# Patient Record
Sex: Male | Born: 1965 | Race: White | Hispanic: No | Marital: Married | Smoking: Current every day smoker
Health system: Southern US, Community
[De-identification: ages and names within clinical notes are randomized; demographics above are authoritative.]

---

## 2004-05-18 ENCOUNTER — Encounter: Admission: RE | Admit: 2004-05-18 | Discharge: 2004-05-18 | Payer: Self-pay | Admitting: Orthopaedic Surgery

## 2004-06-09 ENCOUNTER — Encounter: Admission: RE | Admit: 2004-06-09 | Discharge: 2004-06-09 | Payer: Self-pay | Admitting: Orthopaedic Surgery

## 2015-01-11 ENCOUNTER — Ambulatory Visit (HOSPITAL_COMMUNITY)
Admission: RE | Admit: 2015-01-11 | Discharge: 2015-01-11 | Disposition: A | Discharge status: Home / Self Care | Payer: BLUE CROSS/BLUE SHIELD | Source: Ambulatory Visit | Attending: Family Medicine | Admitting: Family Medicine

## 2015-01-11 ENCOUNTER — Other Ambulatory Visit (HOSPITAL_COMMUNITY): Payer: Self-pay | Admitting: Family Medicine

## 2015-01-11 ENCOUNTER — Encounter (HOSPITAL_COMMUNITY): Payer: Self-pay | Admitting: Emergency Medicine

## 2015-01-11 ENCOUNTER — Inpatient Hospital Stay (HOSPITAL_COMMUNITY)
Admission: EM | Admit: 2015-01-11 | Discharge: 2015-01-13 | DRG: 340 | Disposition: A | Discharge status: Home / Self Care | Payer: BLUE CROSS/BLUE SHIELD | Attending: Surgery | Admitting: Surgery

## 2015-01-11 DIAGNOSIS — Z8379 Family history of other diseases of the digestive system: Secondary | ICD-10-CM

## 2015-01-11 DIAGNOSIS — R1031 Right lower quadrant pain: Secondary | ICD-10-CM

## 2015-01-11 DIAGNOSIS — D3502 Benign neoplasm of left adrenal gland: Secondary | ICD-10-CM | POA: Diagnosis present

## 2015-01-11 DIAGNOSIS — K3580 Unspecified acute appendicitis: Secondary | ICD-10-CM | POA: Diagnosis present

## 2015-01-11 DIAGNOSIS — Z720 Tobacco use: Secondary | ICD-10-CM

## 2015-01-11 DIAGNOSIS — K353 Acute appendicitis with localized peritonitis, without perforation or gangrene: Secondary | ICD-10-CM

## 2015-01-11 DIAGNOSIS — F17200 Nicotine dependence, unspecified, uncomplicated: Secondary | ICD-10-CM | POA: Diagnosis present

## 2015-01-11 LAB — BASIC METABOLIC PANEL
Anion gap: 10 (ref 5–15)
BUN: 17 mg/dL (ref 6–20)
CO2: 24 mmol/L (ref 22–32)
Calcium: 8.7 mg/dL — ABNORMAL LOW (ref 8.9–10.3)
Chloride: 102 mmol/L (ref 101–111)
Creatinine, Ser: 0.95 mg/dL (ref 0.61–1.24)
GFR calc Af Amer: >60 mL/min (ref >60)
GFR calc non Af Amer: >60 mL/min (ref >60)
Glucose, Bld: 97 mg/dL (ref 65–99)
Potassium: 3.6 mmol/L (ref 3.5–5.1)
Sodium: 136 mmol/L (ref 135–145)

## 2015-01-11 LAB — CBC WITH DIFFERENTIAL/PLATELET
Basophils Absolute: 0 10*3/uL (ref 0.0–0.1)
Basophils Relative: 0 %
Eosinophils Absolute: 0 10*3/uL (ref 0.0–0.7)
Eosinophils Relative: 0 %
HCT: 46.4 % (ref 39.0–52.0)
Hemoglobin: 15.9 g/dL (ref 13.0–17.0)
Lymphocytes Relative: 12 %
Lymphs Abs: 1.4 10*3/uL (ref 0.7–4.0)
MCH: 30.8 pg (ref 26.0–34.0)
MCHC: 34.3 g/dL (ref 30.0–36.0)
MCV: 89.9 fL (ref 78.0–100.0)
Monocytes Absolute: 1.1 10*3/uL — ABNORMAL HIGH (ref 0.1–1.0)
Monocytes Relative: 9 %
Neutro Abs: 9.3 10*3/uL — ABNORMAL HIGH (ref 1.7–7.7)
Neutrophils Relative %: 79 %
Platelets: 190 10*3/uL (ref 150–400)
RBC: 5.16 MIL/uL (ref 4.22–5.81)
RDW: 13.9 % (ref 11.5–15.5)
WBC: 11.8 10*3/uL — ABNORMAL HIGH (ref 4.0–10.5)

## 2015-01-11 MED ORDER — IOHEXOL 300 MG/ML  SOLN
75.0000 mL | Freq: Once | INTRAMUSCULAR | Status: AC | PRN
  Administered 2015-01-11: 75 mL via INTRAVENOUS

## 2015-01-11 NOTE — ED Provider Notes (Signed)
CSN: [id number]     Arrival date & time 01/11/15  2048 History   First MD Initiated Contact with Patient 01/11/15 2108     Chief Complaint  Patient presents with  . appendicitis      (Consider location/radiation/quality/duration/timing/severity/associated sxs/prior Treatment) HPI Patient presents to the emergency department with abdominal pain that started late Saturday evening and got worse as the day went on Sunday and Continued Pain Today.  He Called His Primary Care Doctor Who Evaluated Him and Then Ordered a CT Scan That He Was Sent Here Due To the Fact That CT Scan Showed Acute Appendicitis.  Patient States That He Has Not Had Any Nausea, Vomiting, diarrhea, bloody stool, hematemesis, dysuria, incontinence, lightheadedness, chest pain, shortness of breath, near syncope or syncope.  the patient states that palpation and movement make the pain worse.  patient states he did not take any medications prior to arrival.  he states that he last ate some day and had a cheeseburger. History reviewed. No pertinent past medical history. History reviewed. No pertinent past surgical history. History reviewed. No pertinent family history. Social History  Substance Use Topics  . Smoking status: Light Tobacco Smoker  . Smokeless tobacco: Never Used  . Alcohol Use: Yes    Review of Systems   All other systems negative except as documented in the HPI. All pertinent positives and negatives as reviewed in the HPI. Allergies  Review of patient's allergies indicates no known allergies.  Home Medications   Prior to Admission medications   Medication Sig Start Date End Date Taking? Authorizing Provider  acetaminophen (TYLENOL) 500 MG tablet Take 1,000 mg by mouth every 6 (six) hours as needed for moderate pain.   Yes Historical Provider, MD  ibuprofen (ADVIL,MOTRIN) 200 MG tablet Take 400 mg by mouth every 6 (six) hours as needed for moderate pain.   Yes Historical Provider, MD  naproxen sodium  (ANAPROX) 220 MG tablet Take 440 mg by mouth 2 (two) times daily as needed (pain).   Yes Historical Provider, MD   BP 127/61 mmHg  Pulse 86  Temp(Src) 99.5 F (37.5 C) (Oral)  Resp 18  Ht 6\' 3"  (1.905 m)  Wt 111.131 kg  BMI 30.62 kg/m2  SpO2 98% Physical Exam  Constitutional: He is oriented to person, place, and time. He appears well-developed and well-nourished. No distress.  HENT:  Head: Normocephalic and atraumatic.  Mouth/Throat: Oropharynx is clear and moist.  Eyes: Pupils are equal, round, and reactive to light.  Neck: Normal range of motion. Neck supple.  Cardiovascular: Normal rate, regular rhythm and normal heart sounds.  Exam reveals no gallop and no friction rub.   No murmur heard. Pulmonary/Chest: Effort normal and breath sounds normal. No respiratory distress. He has no wheezes.  Abdominal: Soft. Bowel sounds are normal. He exhibits no distension. There is tenderness. There is rebound and guarding.    Neurological: He is alert and oriented to person, place, and time. He exhibits normal muscle tone. Coordination normal.  Skin: Skin is warm and dry. No rash noted. No erythema.  Psychiatric: He has a normal mood and affect. His behavior is normal.  Nursing note and vitals reviewed.   ED Course  Procedures (including critical care time) Labs Review Labs Reviewed  CBC WITH DIFFERENTIAL/PLATELET - Abnormal; Notable for the following:    WBC 11.8 (*)    Neutro Abs 9.3 (*)    Monocytes Absolute 1.1 (*)    All other components within normal limits  BASIC METABOLIC  PANEL    Imaging Review Ct Abdomen Pelvis W Contrast  01/11/2015  CLINICAL DATA:  Right lower quadrant pain EXAM: CT ABDOMEN AND PELVIS WITH CONTRAST TECHNIQUE: Multidetector CT imaging of the abdomen and pelvis was performed using the standard protocol following bolus administration of intravenous contrast. Oral contrast was also administered. CONTRAST:  76mL OMNIPAQUE IOHEXOL 300 MG/ML  SOLN COMPARISON:   None. FINDINGS: Lower chest: There is mild bibasilar atelectasis. Lung bases otherwise are clear. Hepatobiliary: No focal liver lesions are identified. Gallbladder wall is not appreciably thickened. There is no biliary duct dilatation. Pancreas: No pancreatic mass or inflammatory focus. Spleen: No splenic lesions are identified. Adrenals/Urinary Tract: Right adrenal appears normal. There is a left adrenal adenoma measuring 2.5 x 2.2 cm. Kidneys bilaterally show no mass or hydronephrosis. There is no renal or ureteral calculus on either side. Urinary bladder is midline with wall thickness within normal limits. Stomach/Bowel: There is no bowel wall or mesenteric thickening. There are a few sigmoid diverticula without diverticulitis. There is fat within the wall of the terminal ileum. No bowel obstruction. No free air or portal venous air. Vascular/Lymphatic: There is no abdominal aortic aneurysm. The major mesenteric arterial vessels appear patent. No adenopathy is seen in the abdomen or pelvis. There are a few small lymph nodes in the right lower quadrant in the area of appendiceal inflammation, likely of reactive etiology. Reproductive: Prostate contains several small calculi. Prostate and seminal vesicles are normal in size and contour. There is no pelvic mass or pelvic fluid collection. Other: There is enlargement of the appendix with thickening of the appendiceal wall and surrounding inflammation consistent with acute appendicitis. No well-defined abscess. No free air is seen in this region. No ascites or abscess is seen in the abdomen or pelvis. Musculoskeletal: There is degenerative change in the lumbar spine. There are no blastic or lytic bone lesions. There is no intramuscular were abdominal wall lesion. IMPRESSION: Evidence of acute appendiceal inflammation. No frank abscess is seen in the periappendiceal region. Small lymph nodes in this area are likely of reactive etiology. Left adrenal adenoma. Fat in the  wall of the terminal ileum.  No bowel obstruction. No renal or ureteral calculus.  No hydronephrosis. Critical Value/emergent results were called by telephone at the time of interpretation on 01/11/2015 at 8:27 pm to Dr. Martinique, who is covering for Dr. Nancy Fetter, who verbally acknowledged these results. We arranged for patient to go straight from CT to the [facility] ED for further evaluation. Electronically Signed   By: Lowella Grip III M.D.   On: 01/11/2015 20:28   I have personally reviewed and evaluated these images and lab results as part of my medical decision-making.   Patient is a CT scan that shows acute appendicitis.  He has a white count of 11,800.  He also has classic right lower quadrant abdominal pain on palpation along with rebound and guarding.  I did speak with general surgery.  The patient's vital signs are otherwise stable.  The patient is advised with plan and all questions were answered  Dalia Heading, PA-C 01/11/15 2217  Sherwood Gambler, MD 01/14/15 1010

## 2015-01-11 NOTE — ED Notes (Signed)
Patient has been having pain since Saturday morning. Patient went to the doctor. Patient was sent to radiology and they sent him here with a diagnosis of an appendicitis.

## 2015-01-12 ENCOUNTER — Encounter (HOSPITAL_COMMUNITY): Admission: EM | Disposition: A | Discharge status: Home / Self Care | Payer: Self-pay | Source: Home / Self Care

## 2015-01-12 ENCOUNTER — Observation Stay (HOSPITAL_COMMUNITY): Payer: BLUE CROSS/BLUE SHIELD | Admitting: Anesthesiology

## 2015-01-12 DIAGNOSIS — Z8379 Family history of other diseases of the digestive system: Secondary | ICD-10-CM | POA: Diagnosis not present

## 2015-01-12 DIAGNOSIS — K353 Acute appendicitis with localized peritonitis, without perforation or gangrene: Secondary | ICD-10-CM | POA: Diagnosis present

## 2015-01-12 DIAGNOSIS — D3502 Benign neoplasm of left adrenal gland: Secondary | ICD-10-CM | POA: Diagnosis present

## 2015-01-12 DIAGNOSIS — F17200 Nicotine dependence, unspecified, uncomplicated: Secondary | ICD-10-CM | POA: Diagnosis present

## 2015-01-12 DIAGNOSIS — R1031 Right lower quadrant pain: Secondary | ICD-10-CM | POA: Diagnosis present

## 2015-01-12 DIAGNOSIS — Z720 Tobacco use: Secondary | ICD-10-CM

## 2015-01-12 DIAGNOSIS — K3580 Unspecified acute appendicitis: Secondary | ICD-10-CM | POA: Diagnosis present

## 2015-01-12 HISTORY — PX: LAPAROSCOPIC APPENDECTOMY: SHX408

## 2015-01-12 LAB — SURGICAL PCR SCREEN
MRSA, PCR: NEGATIVE
Staphylococcus aureus: NEGATIVE

## 2015-01-12 SURGERY — APPENDECTOMY, LAPAROSCOPIC
Anesthesia: General | Site: Abdomen

## 2015-01-12 MED ORDER — ZOLPIDEM TARTRATE 5 MG PO TABS
5.0000 mg | ORAL_TABLET | Freq: Every evening | ORAL | Status: DC | PRN
  Administered 2015-01-12: 5 mg via ORAL
  Filled 2015-01-12: qty 1

## 2015-01-12 MED ORDER — HYDROMORPHONE HCL 1 MG/ML IJ SOLN
0.5000 mg | INTRAMUSCULAR | Status: DC | PRN
  Administered 2015-01-12: 1 mg via INTRAVENOUS
  Filled 2015-01-12: qty 1

## 2015-01-12 MED ORDER — MIDAZOLAM HCL 5 MG/5ML IJ SOLN
INTRAMUSCULAR | Status: DC | PRN
  Administered 2015-01-12: 2 mg via INTRAVENOUS

## 2015-01-12 MED ORDER — MENTHOL 3 MG MT LOZG
1.0000 | LOZENGE | OROMUCOSAL | Status: DC | PRN
  Filled 2015-01-12: qty 9

## 2015-01-12 MED ORDER — PROMETHAZINE HCL 25 MG/ML IJ SOLN
6.2500 mg | INTRAMUSCULAR | Status: DC | PRN
  Filled 2015-01-12: qty 1

## 2015-01-12 MED ORDER — GLYCOPYRROLATE 0.2 MG/ML IJ SOLN
INTRAMUSCULAR | Status: AC
  Filled 2015-01-12: qty 3

## 2015-01-12 MED ORDER — LACTATED RINGERS IV SOLN
INTRAVENOUS | Status: DC | PRN
  Administered 2015-01-12 (×2): via INTRAVENOUS

## 2015-01-12 MED ORDER — NAPROXEN 500 MG PO TABS
500.0000 mg | ORAL_TABLET | Freq: Two times a day (BID) | ORAL | Status: DC
  Administered 2015-01-12 – 2015-01-13 (×2): 500 mg via ORAL
  Filled 2015-01-12 (×4): qty 1

## 2015-01-12 MED ORDER — LACTATED RINGERS IV BOLUS (SEPSIS)
1000.0000 mL | Freq: Once | INTRAVENOUS | Status: AC
  Administered 2015-01-12: 1000 mL via INTRAVENOUS

## 2015-01-12 MED ORDER — PHENOL 1.4 % MT LIQD
2.0000 | OROMUCOSAL | Status: DC | PRN
  Filled 2015-01-12: qty 177

## 2015-01-12 MED ORDER — ROCURONIUM BROMIDE 100 MG/10ML IV SOLN
INTRAVENOUS | Status: AC
  Filled 2015-01-12: qty 1

## 2015-01-12 MED ORDER — KCL IN DEXTROSE-NACL 40-5-0.45 MEQ/L-%-% IV SOLN
INTRAVENOUS | Status: DC
  Administered 2015-01-12: 14:00:00 via INTRAVENOUS
  Administered 2015-01-13: 75 mL/h via INTRAVENOUS
  Filled 2015-01-12 (×2): qty 1000

## 2015-01-12 MED ORDER — DEXTROSE 5 % IV SOLN
2.0000 g | Freq: Every day | INTRAVENOUS | Status: AC
  Administered 2015-01-12: 2 g via INTRAVENOUS
  Filled 2015-01-12: qty 2

## 2015-01-12 MED ORDER — CHLORHEXIDINE GLUCONATE 4 % EX LIQD: 1.0000 "application " | Freq: Once | CUTANEOUS | Status: DC

## 2015-01-12 MED ORDER — DEXTROSE 5 % IV SOLN
2.0000 g | Freq: Every day | INTRAVENOUS | Status: DC
  Administered 2015-01-12: 2 g via INTRAVENOUS
  Filled 2015-01-12: qty 2

## 2015-01-12 MED ORDER — FENTANYL CITRATE (PF) 100 MCG/2ML IJ SOLN
INTRAMUSCULAR | Status: DC | PRN
  Administered 2015-01-12: 100 ug via INTRAVENOUS
  Administered 2015-01-12: 50 ug via INTRAVENOUS

## 2015-01-12 MED ORDER — FENTANYL CITRATE (PF) 250 MCG/5ML IJ SOLN
INTRAMUSCULAR | Status: AC
  Filled 2015-01-12: qty 5

## 2015-01-12 MED ORDER — BUPIVACAINE-EPINEPHRINE (PF) 0.25% -1:200000 IJ SOLN
INTRAMUSCULAR | Status: AC
  Filled 2015-01-12: qty 30

## 2015-01-12 MED ORDER — ROCURONIUM BROMIDE 100 MG/10ML IV SOLN
INTRAVENOUS | Status: DC | PRN
  Administered 2015-01-12: 20 mg via INTRAVENOUS
  Administered 2015-01-12: 30 mg via INTRAVENOUS
  Administered 2015-01-12: 10 mg via INTRAVENOUS

## 2015-01-12 MED ORDER — MAGIC MOUTHWASH
15.0000 mL | Freq: Four times a day (QID) | ORAL | Status: DC | PRN
  Filled 2015-01-12: qty 15

## 2015-01-12 MED ORDER — METRONIDAZOLE IN NACL 5-0.79 MG/ML-% IV SOLN
INTRAVENOUS | Status: AC
  Filled 2015-01-12: qty 100

## 2015-01-12 MED ORDER — DIPHENHYDRAMINE HCL 50 MG/ML IJ SOLN: 12.5000 mg | Freq: Four times a day (QID) | INTRAMUSCULAR | Status: DC | PRN

## 2015-01-12 MED ORDER — LACTATED RINGERS IR SOLN
Status: DC | PRN
  Administered 2015-01-12: 1000 mL

## 2015-01-12 MED ORDER — LACTATED RINGERS IV SOLN: INTRAVENOUS | Status: DC

## 2015-01-12 MED ORDER — HYDROMORPHONE HCL 1 MG/ML IJ SOLN: 0.2500 mg | INTRAMUSCULAR | Status: DC | PRN

## 2015-01-12 MED ORDER — LIDOCAINE HCL (CARDIAC) 20 MG/ML IV SOLN
INTRAVENOUS | Status: AC
  Filled 2015-01-12: qty 5

## 2015-01-12 MED ORDER — KCL IN DEXTROSE-NACL 40-5-0.45 MEQ/L-%-% IV SOLN
INTRAVENOUS | Status: DC
  Administered 2015-01-12: 06:00:00 via INTRAVENOUS
  Filled 2015-01-12 (×2): qty 1000

## 2015-01-12 MED ORDER — GLYCOPYRROLATE 0.2 MG/ML IJ SOLN
INTRAMUSCULAR | Status: DC | PRN
  Administered 2015-01-12: 0.6 mg via INTRAVENOUS

## 2015-01-12 MED ORDER — OXYCODONE HCL 5 MG PO TABS
5.0000 mg | ORAL_TABLET | ORAL | Status: DC | PRN
  Administered 2015-01-12: 5 mg via ORAL
  Administered 2015-01-12 – 2015-01-13 (×2): 10 mg via ORAL
  Filled 2015-01-12: qty 2
  Filled 2015-01-12: qty 1
  Filled 2015-01-12: qty 2

## 2015-01-12 MED ORDER — PROPOFOL 10 MG/ML IV BOLUS
INTRAVENOUS | Status: AC
  Filled 2015-01-12: qty 20

## 2015-01-12 MED ORDER — ONDANSETRON 4 MG PO TBDP: 4.0000 mg | ORAL_TABLET | Freq: Four times a day (QID) | ORAL | Status: DC | PRN

## 2015-01-12 MED ORDER — ONDANSETRON HCL 4 MG/2ML IJ SOLN
INTRAMUSCULAR | Status: DC | PRN
  Administered 2015-01-12: 4 mg via INTRAVENOUS

## 2015-01-12 MED ORDER — ONDANSETRON HCL 4 MG/2ML IJ SOLN
4.0000 mg | Freq: Four times a day (QID) | INTRAMUSCULAR | Status: DC
  Administered 2015-01-12 – 2015-01-13 (×2): 4 mg via INTRAVENOUS
  Filled 2015-01-12 (×3): qty 2

## 2015-01-12 MED ORDER — LIP MEDEX EX OINT
1.0000 "application " | TOPICAL_OINTMENT | Freq: Two times a day (BID) | CUTANEOUS | Status: DC
  Administered 2015-01-12: 1 via TOPICAL
  Filled 2015-01-12 (×2): qty 7

## 2015-01-12 MED ORDER — NEOSTIGMINE METHYLSULFATE 10 MG/10ML IV SOLN
INTRAVENOUS | Status: DC | PRN
  Administered 2015-01-12: 5 mg via INTRAVENOUS

## 2015-01-12 MED ORDER — ACETAMINOPHEN 650 MG RE SUPP: 650.0000 mg | Freq: Four times a day (QID) | RECTAL | Status: DC | PRN

## 2015-01-12 MED ORDER — MIDAZOLAM HCL 2 MG/2ML IJ SOLN
INTRAMUSCULAR | Status: AC
  Filled 2015-01-12: qty 2

## 2015-01-12 MED ORDER — ONDANSETRON HCL 4 MG/2ML IJ SOLN
INTRAMUSCULAR | Status: AC
  Filled 2015-01-12: qty 2

## 2015-01-12 MED ORDER — LIDOCAINE HCL (CARDIAC) 20 MG/ML IV SOLN
INTRAVENOUS | Status: DC | PRN
  Administered 2015-01-12: 100 mg via INTRAVENOUS

## 2015-01-12 MED ORDER — METOPROLOL TARTRATE 1 MG/ML IV SOLN
5.0000 mg | Freq: Four times a day (QID) | INTRAVENOUS | Status: DC | PRN
Start: 2015-01-12 — End: 2015-01-13
  Filled 2015-01-12: qty 5

## 2015-01-12 MED ORDER — ALUM & MAG HYDROXIDE-SIMETH 200-200-20 MG/5ML PO SUSP: 30.0000 mL | Freq: Four times a day (QID) | ORAL | Status: DC | PRN

## 2015-01-12 MED ORDER — METOPROLOL TARTRATE 12.5 MG HALF TABLET
12.5000 mg | ORAL_TABLET | Freq: Two times a day (BID) | ORAL | Status: DC | PRN
  Filled 2015-01-12: qty 1

## 2015-01-12 MED ORDER — HEPARIN SODIUM (PORCINE) 5000 UNIT/ML IJ SOLN
5000.0000 [IU] | Freq: Three times a day (TID) | INTRAMUSCULAR | Status: DC
  Filled 2015-01-12 (×2): qty 1

## 2015-01-12 MED ORDER — BUPIVACAINE-EPINEPHRINE 0.25% -1:200000 IJ SOLN
INTRAMUSCULAR | Status: DC | PRN
  Administered 2015-01-12: 14 mL

## 2015-01-12 MED ORDER — LACTATED RINGERS IV BOLUS (SEPSIS): 1000.0000 mL | Freq: Three times a day (TID) | INTRAVENOUS | Status: DC | PRN

## 2015-01-12 MED ORDER — NEOSTIGMINE METHYLSULFATE 10 MG/10ML IV SOLN
INTRAVENOUS | Status: AC
  Filled 2015-01-12: qty 1

## 2015-01-12 MED ORDER — PROPOFOL 10 MG/ML IV BOLUS
INTRAVENOUS | Status: DC | PRN
  Administered 2015-01-12: 180 mg via INTRAVENOUS

## 2015-01-12 MED ORDER — ACETAMINOPHEN 325 MG PO TABS
650.0000 mg | ORAL_TABLET | Freq: Four times a day (QID) | ORAL | Status: DC | PRN
  Administered 2015-01-12: 650 mg via ORAL

## 2015-01-12 MED ORDER — SACCHAROMYCES BOULARDII 250 MG PO CAPS
250.0000 mg | ORAL_CAPSULE | Freq: Two times a day (BID) | ORAL | Status: DC
  Administered 2015-01-12: 250 mg via ORAL
  Filled 2015-01-12 (×4): qty 1

## 2015-01-12 MED ORDER — METHOCARBAMOL 1000 MG/10ML IJ SOLN
1000.0000 mg | Freq: Four times a day (QID) | INTRAVENOUS | Status: DC | PRN
  Filled 2015-01-12: qty 10

## 2015-01-12 MED ORDER — BISACODYL 10 MG RE SUPP: 10.0000 mg | Freq: Two times a day (BID) | RECTAL | Status: DC | PRN

## 2015-01-12 MED ORDER — SUCCINYLCHOLINE CHLORIDE 20 MG/ML IJ SOLN
INTRAMUSCULAR | Status: DC | PRN
  Administered 2015-01-12: 100 mg via INTRAVENOUS

## 2015-01-12 MED ORDER — ACETAMINOPHEN 325 MG PO TABS
325.0000 mg | ORAL_TABLET | Freq: Four times a day (QID) | ORAL | Status: DC | PRN
  Filled 2015-01-12: qty 2

## 2015-01-12 MED ORDER — METHOCARBAMOL 500 MG PO TABS: 1000.0000 mg | ORAL_TABLET | Freq: Four times a day (QID) | ORAL | Status: DC | PRN

## 2015-01-12 MED ORDER — METRONIDAZOLE IN NACL 5-0.79 MG/ML-% IV SOLN
500.0000 mg | Freq: Four times a day (QID) | INTRAVENOUS | Status: DC
  Administered 2015-01-12 – 2015-01-13 (×5): 500 mg via INTRAVENOUS
  Filled 2015-01-12 (×6): qty 100

## 2015-01-12 SURGICAL SUPPLY — 41 items
APPLIER CLIP 5 13 M/L LIGAMAX5 (MISCELLANEOUS)
APPLIER CLIP ROT 10 11.4 M/L (STAPLE)
APR CLP MED LRG 11.4X10 (STAPLE)
APR CLP MED LRG 5 ANG JAW (MISCELLANEOUS)
BAG SPEC RTRVL LRG 6X4 10 (ENDOMECHANICALS) ×1
CABLE HIGH FREQUENCY MONO STRZ (ELECTRODE) ×2 IMPLANT
CHLORAPREP W/TINT 26ML (MISCELLANEOUS) ×3 IMPLANT
CLIP APPLIE 5 13 M/L LIGAMAX5 (MISCELLANEOUS) IMPLANT
CLIP APPLIE ROT 10 11.4 M/L (STAPLE) IMPLANT
COVER SURGICAL LIGHT HANDLE (MISCELLANEOUS) ×2 IMPLANT
CUTTER FLEX LINEAR 45M (STAPLE) ×2 IMPLANT
DECANTER SPIKE VIAL GLASS SM (MISCELLANEOUS) ×3 IMPLANT
DRAPE LAPAROSCOPIC ABDOMINAL (DRAPES) ×3 IMPLANT
ELECT REM PT RETURN 9FT ADLT (ELECTROSURGICAL) ×3
ELECTRODE REM PT RTRN 9FT ADLT (ELECTROSURGICAL) ×1 IMPLANT
GLOVE BIO SURGEON STRL SZ8.5 (GLOVE) ×2 IMPLANT
GLOVE BIOGEL PI IND STRL 7.0 (GLOVE) IMPLANT
GLOVE BIOGEL PI IND STRL 7.5 (GLOVE) ×1 IMPLANT
GLOVE BIOGEL PI INDICATOR 7.0 (GLOVE) ×2
GLOVE BIOGEL PI INDICATOR 7.5 (GLOVE) ×2
GLOVE ECLIPSE 7.5 STRL STRAW (GLOVE) ×3 IMPLANT
GLOVE SURG SS PI 7.5 STRL IVOR (GLOVE) ×2 IMPLANT
GOWN STRL REUS W/TWL XL LVL3 (GOWN DISPOSABLE) ×8 IMPLANT
KIT BASIN OR (CUSTOM PROCEDURE TRAY) ×3 IMPLANT
LIQUID BAND (GAUZE/BANDAGES/DRESSINGS) ×2 IMPLANT
POUCH SPECIMEN RETRIEVAL 10MM (ENDOMECHANICALS) ×3 IMPLANT
RELOAD 45 VASCULAR/THIN (ENDOMECHANICALS) IMPLANT
RELOAD STAPLE 45 2.5 WHT GRN (ENDOMECHANICALS) IMPLANT
RELOAD STAPLE 45 3.5 BLU ETS (ENDOMECHANICALS) IMPLANT
RELOAD STAPLE TA45 3.5 REG BLU (ENDOMECHANICALS) ×3 IMPLANT
SCISSORS LAP 5X35 DISP (ENDOMECHANICALS) ×3 IMPLANT
SET IRRIG TUBING LAPAROSCOPIC (IRRIGATION / IRRIGATOR) ×3 IMPLANT
SHEARS HARMONIC ACE PLUS 36CM (ENDOMECHANICALS) ×3 IMPLANT
SLEEVE XCEL OPT CAN 5 100 (ENDOMECHANICALS) ×3 IMPLANT
SUT MNCRL AB 4-0 PS2 18 (SUTURE) ×3 IMPLANT
TOWEL OR 17X26 10 PK STRL BLUE (TOWEL DISPOSABLE) ×3 IMPLANT
TRAY FOLEY W/METER SILVER 14FR (SET/KITS/TRAYS/PACK) ×1 IMPLANT
TRAY FOLEY W/METER SILVER 16FR (SET/KITS/TRAYS/PACK) ×3 IMPLANT
TRAY LAPAROSCOPIC (CUSTOM PROCEDURE TRAY) ×3 IMPLANT
TROCAR BLADELESS OPT 5 100 (ENDOMECHANICALS) ×3 IMPLANT
TROCAR XCEL BLUNT TIP 100MML (ENDOMECHANICALS) ×3 IMPLANT

## 2015-01-12 NOTE — Anesthesia Procedure Notes (Signed)
Procedure Name: Intubation Date/Time: 01/12/2015 11:31 AM Performed by: Dione Booze Pre-anesthesia Checklist: Emergency Drugs available, Suction available, Patient being monitored and Patient identified Patient Re-evaluated:Patient Re-evaluated prior to inductionOxygen Delivery Method: Circle system utilized Preoxygenation: Pre-oxygenation with 100% oxygen Intubation Type: IV induction, Rapid sequence and Cricoid Pressure applied Laryngoscope Size: Mac and 4 Grade View: Grade I Tube type: Oral Tube size: 7.5 mm Number of attempts: 1 Placement Confirmation: ETT inserted through vocal cords under direct vision and breath sounds checked- equal and bilateral Secured at: 22 cm Tube secured with: Tape Dental Injury: Teeth and Oropharynx as per pre-operative assessment

## 2015-01-12 NOTE — Op Note (Signed)
Preoperative Diagnosis: Acute appendicitis with localized peritonitis [K35.3]  Postoprative Diagnosis: Acute appendicitis with localized peritonitis [K35.3]  Procedure: Procedure(s): APPENDECTOMY LAPAROSCOPIC   Surgeon: Excell Seltzer T   Assistants: none  Anesthesia:  General endotracheal anesthesia  Indications:  Patient is a 49 year old male who presents with typical symptoms and physical findings for acute appendicitis. CT scan has confirmed acute appendicitis without apparent complication. We have recommended proceeding with laparoscopic appendectomy. I discussed the procedure with the patient including its nature and expected recovery and indications as well as alternatives and risks of the procedure including anesthetic complications, bleeding, infection, visceral injury or need to convert to an open procedure. All his questions were answered and he agrees to proceed.    Procedure Detail:  Patient was brought to the operating room, placed in the supine position on the operating table and general endotracheal anesthesia induced. He was already on broad-spectrum IV antibiotics. Foley catheter was placed. The abdomen was widely sterilely prepped and draped. Patient timeout was performed and correct patient and procedure verified. Access was obtained with a 1.5 cm incision at the umbilicus with an open Hassan technique through a mattress suture of 0 Vicryl and pneumoperitoneum established. Under direct vision 5 mm trochars were placed in the right upper quadrant and left lower quadrant. The patient was placed in steep reverse Trendelenburg rotated to the left. The appendix was identified just inferior to the cecum and was markedly thickened and acutely inflamed with exudate and probably some early gangrenous changes. The appendix was very adherent to the lateral peritoneum and posteriorly to the retroperitoneum. A somewhat tedious dissection was performed with careful blunt dissection and  suction as well as Harmonic scalpel mobilizing the lateral peritoneal attachments and bluntly dissecting the appendix and mesial appendix up out of retroperitoneal adhesions. The appendix was densely adherent to the wall of the cecum but was able to be gradually teased away with blunt dissection. After extensive dissection I was able to identify the base of the appendix and tip of the cecum which were relatively uninflamed. I was able to encircle the appendix at the base and then isolate the remaining mesial appendix distally which was sequentially divided with Harmonic scalpel until the appendix was completely freed out of the tip of the cecum. The appendix was divided from the tip of the cecum with a single firing of the GIA 45 mm blue load stapler. Staple line was intact and without bleeding. The appendix was removed with an Endo Catch bag. The right lower quadrant was thoroughly irrigated until clear and hemostasis assured. There was no evidence of injury to the terminal ileum or cecum and the staple line was intact and healthy tissue. All CO2 was evacuated and trochars removed and the mattress suture secured at the umbilicus. Skin incisions were closed with subcuticular Monocryl and Dermabond. Sponge needle and instrument counts were correct.    Findings: Acute appendicitis with exudate and possible early gangrenous changes, no perforation  Estimated Blood Loss:  less than 50 mL         Drains: none  Blood Given: none          Specimens: appendix        Complications:  * No complications entered in OR log *         Disposition: PACU - hemodynamically stable.         Condition: stable

## 2015-01-12 NOTE — Transfer of Care (Signed)
Immediate Anesthesia Transfer of Care Note  Patient: Douglas Mora  Procedure(s) Performed: Procedure(s): APPENDECTOMY LAPAROSCOPIC (N/A)  Patient Location: PACU  Anesthesia Type:General  Level of Consciousness: Patient easily awoken, sedated, comfortable, cooperative, following commands, responds to stimulation.   Airway & Oxygen Therapy: Patient spontaneously breathing, ventilating well, oxygen via simple oxygen mask.  Post-op Assessment: Report given to PACU RN, vital signs reviewed and stable, moving all extremities.   Post vital signs: Reviewed and stable.  Complications: No apparent anesthesia complications

## 2015-01-12 NOTE — H&P (Signed)
[facility]  [address] [place], [place] [address] Phone: [phone] FAX: Emanuel  June 01, 1965 [id number]  CARE TEAM:  PCP: No primary care provider on file.  Outpatient Care Team: No care team member to display  Inpatient Treatment Team: Treatment Team: Registered Nurse: Veatrice Kells, RN; Physician Assistant: Dalia Heading, PA-C; Consulting Physician: Nolon Nations, MD  This patient is a 49 y.o.male who presents today for surgical evaluation at the request of Irena Cords, [facility].   Reason for evaluation: Abdominal pain and probable appendicitis  49 year old male without much past history who had abdominal pain starting 48 hours ago.  Became more intense.  Call his primary care physician.  CT scan ordered.  Concerning for appendicitis.  Told go to emergency room.  Workup and evaluation suspicious for appendicitis.  Surgical consultation requested around 10:17 PM.   I just completed my last surgery.  Patient here today with his wife.  Has significant pain right lower quadrant.  Does not want to move.  Car riding bed shaking cough deftly bother him.  He has never had a colonoscopy.  No masses or bowel movement every morning.  No severe bouts of constipation or diarrhea.  No bleeding.  No prior abdominal surgery.  No history of hernias.  No difficulty with urination.  No personal nor family history of GI/colon cancer, inflammatory bowel disease, irritable bowel syndrome, allergy such as Celiac Sprue, dietary/dairy problems, colitis, ulcers nor gastritis.  No recent sick contacts/gastroenteritis.  No travel outside the country.  No changes in diet.  No dysphagia to solids or liquids.  No significant heartburn or reflux.  No hematochezia, hematemesis, coffee ground emesis.  No evidence of prior gastric/peptic ulceration.    History reviewed. No pertinent past medical history.  History reviewed. No pertinent past  surgical history.  Social History   Social History  . Marital Status: Single    Spouse Name: N/A  . Number of Children: N/A  . Years of Education: N/A   Occupational History  . Not on file.   Social History Main Topics  . Smoking status: Light Tobacco Smoker  . Smokeless tobacco: Never Used  . Alcohol Use: Yes  . Drug Use: No  . Sexual Activity: Not on file   Other Topics Concern  . Not on file   Social History Narrative  . No narrative on file    History reviewed. No pertinent family history.  No current facility-administered medications for this encounter.   Current Outpatient Prescriptions  Medication Sig Dispense Refill  . acetaminophen (TYLENOL) 500 MG tablet Take 1,000 mg by mouth every 6 (six) hours as needed for moderate pain.    Marland Kitchen ibuprofen (ADVIL,MOTRIN) 200 MG tablet Take 400 mg by mouth every 6 (six) hours as needed for moderate pain.    . naproxen sodium (ANAPROX) 220 MG tablet Take 440 mg by mouth 2 (two) times daily as needed (pain).       No Known Allergies  ROS: Constitutional:  No fevers, chills, sweats.  Weight stable Eyes:  No vision changes, No discharge HENT:  No sore throats, nasal drainage Lymph: No neck swelling, No bruising easily Pulmonary:  No cough, productive sputum CV: No orthopnea, PND  Patient walks 60 minutes for about 2 miles without difficulty.  No exertional chest/neck/shoulder/arm pain. GI:  No personal nor family history of GI/colon cancer, inflammatory bowel disease, irritable bowel syndrome, allergy such as  Celiac Sprue, dietary/dairy problems, colitis, ulcers nor gastritis.  No recent sick contacts/gastroenteritis.  No travel outside the country.  No changes in diet. Renal: No UTIs, No hematuria Genital:  No drainage, bleeding, masses Musculoskeletal: No severe joint pain.  Good ROM major joints Skin:  No sores or lesions.  No rashes Heme/Lymph:  No easy bleeding.  No swollen lymph nodes Neuro: No focal weakness/numbness.   No seizures Psych: No suicidal ideation.  No hallucinations  BP 127/61 mmHg  Pulse 86  Temp(Src) 99.5 F (37.5 C) (Oral)  Resp 18  Ht 6' 3"  (1.905 m)  Wt 111.131 kg (245 lb)  BMI 30.62 kg/m2  SpO2 98%  Physical Exam: General: Pt awake/alert/oriented x4 in  no major acute distress Eyes: PERRL, normal EOM. Sclera nonicteric Neuro: CN II-XII intact w/o focal sensory/motor deficits. Lymph: No head/neck/groin lymphadenopathy Psych:  No delerium/psychosis/paranoia HENT: Normocephalic, Mucus membranes moist.  No thrush Neck: Supple, No tracheal deviation Chest: No pain.  Good respiratory excursion. CV:  Pulses intact.  Regular rhythm Abdomen: Soft, Nondistended.  .  Tender right lower quadrant over McBurney's point with voluntary guarding.  Reproduction of pain with bed shaking cough.  Positive Rosvig sign.  No Diastases recti.  No umbilical herniaNontender.  No incarcerated hernias. Genital: Normal external male genitalia.  No inguinal hernias.  Testes normal Ext:  SCDs BLE.  No significant edema.  No cyanosis Skin: No petechiae / purpurea.  No major sores Musculoskeletal: No severe joint pain.  Good ROM major joints   Results:   Labs: Results for orders placed or performed during the hospital encounter of 01/11/15 (from the past 48 hour(s))  Basic metabolic panel     Status: Abnormal   Collection Time: 01/11/15 10:03 PM  Result Value Ref Range   Sodium 136 135 - 145 mmol/L   Potassium 3.6 3.5 - 5.1 mmol/L   Chloride 102 101 - 111 mmol/L   CO2 24 22 - 32 mmol/L   Glucose, Bld 97 65 - 99 mg/dL   BUN 17 6 - 20 mg/dL   Creatinine, Ser 0.95 0.61 - 1.24 mg/dL   Calcium 8.7 (L) 8.9 - 10.3 mg/dL   GFR calc non Af Amer >60 >60 mL/min   GFR calc Af Amer >60 >60 mL/min    Comment: (NOTE) The eGFR has been calculated using the CKD EPI equation. This calculation has not been validated in all clinical situations. eGFR's persistently <60 mL/min signify possible Chronic  Kidney Disease.    Anion gap 10 5 - 15  CBC with Differential     Status: Abnormal   Collection Time: 01/11/15 10:03 PM  Result Value Ref Range   WBC 11.8 (H) 4.0 - 10.5 K/uL   RBC 5.16 4.22 - 5.81 MIL/uL   Hemoglobin 15.9 13.0 - 17.0 g/dL   HCT 46.4 39.0 - 52.0 %   MCV 89.9 78.0 - 100.0 fL   MCH 30.8 26.0 - 34.0 pg   MCHC 34.3 30.0 - 36.0 g/dL   RDW 13.9 11.5 - 15.5 %   Platelets 190 150 - 400 K/uL   Neutrophils Relative % 79 %   Neutro Abs 9.3 (H) 1.7 - 7.7 K/uL   Lymphocytes Relative 12 %   Lymphs Abs 1.4 0.7 - 4.0 K/uL   Monocytes Relative 9 %   Monocytes Absolute 1.1 (H) 0.1 - 1.0 K/uL   Eosinophils Relative 0 %   Eosinophils Absolute 0.0 0.0 - 0.7 K/uL   Basophils Relative 0 %  Basophils Absolute 0.0 0.0 - 0.1 K/uL    Imaging / Studies: Ct Abdomen Pelvis W Contrast  01/11/2015  CLINICAL DATA:  Right lower quadrant pain EXAM: CT ABDOMEN AND PELVIS WITH CONTRAST TECHNIQUE: Multidetector CT imaging of the abdomen and pelvis was performed using the standard protocol following bolus administration of intravenous contrast. Oral contrast was also administered. CONTRAST:  21m OMNIPAQUE IOHEXOL 300 MG/ML  SOLN COMPARISON:  None. FINDINGS: Lower chest: There is mild bibasilar atelectasis. Lung bases otherwise are clear. Hepatobiliary: No focal liver lesions are identified. Gallbladder wall is not appreciably thickened. There is no biliary duct dilatation. Pancreas: No pancreatic mass or inflammatory focus. Spleen: No splenic lesions are identified. Adrenals/Urinary Tract: Right adrenal appears normal. There is a left adrenal adenoma measuring 2.5 x 2.2 cm. Kidneys bilaterally show no mass or hydronephrosis. There is no renal or ureteral calculus on either side. Urinary bladder is midline with wall thickness within normal limits. Stomach/Bowel: There is no bowel wall or mesenteric thickening. There are a few sigmoid diverticula without diverticulitis. There is fat within the wall of the  terminal ileum. No bowel obstruction. No free air or portal venous air. Vascular/Lymphatic: There is no abdominal aortic aneurysm. The major mesenteric arterial vessels appear patent. No adenopathy is seen in the abdomen or pelvis. There are a few small lymph nodes in the right lower quadrant in the area of appendiceal inflammation, likely of reactive etiology. Reproductive: Prostate contains several small calculi. Prostate and seminal vesicles are normal in size and contour. There is no pelvic mass or pelvic fluid collection. Other: There is enlargement of the appendix with thickening of the appendiceal wall and surrounding inflammation consistent with acute appendicitis. No well-defined abscess. No free air is seen in this region. No ascites or abscess is seen in the abdomen or pelvis. Musculoskeletal: There is degenerative change in the lumbar spine. There are no blastic or lytic bone lesions. There is no intramuscular were abdominal wall lesion. IMPRESSION: Evidence of acute appendiceal inflammation. No frank abscess is seen in the periappendiceal region. Small lymph nodes in this area are likely of reactive etiology. Left adrenal adenoma. Fat in the wall of the terminal ileum.  No bowel obstruction. No renal or ureteral calculus.  No hydronephrosis. Critical Value/emergent results were called by telephone at the time of interpretation on 01/11/2015 at 8:27 pm to Dr. JMartinique who is covering for Dr. SNancy Fetter who verbally acknowledged these results. [facility] Electronically Signed   By: WLowella GripIII M.D.   On: 01/11/2015 20:28    Medications / Allergies: per chart  Antibiotics: Anti-infectives    None      Assessment  Douglas Mora 49y.o. male       Problem List:  Principal Problem:   Acute appendicitis Active Problems:   Adenoma of left adrenal gland   Tobacco abuse   History physical and CT scan  strongly suspicious for acute appendicitis  Plan:  Admit.  IV fluids.  IV antibiotics  Diagnostic laparoscopy with appendectomy.  Dr HExcell Seltzerin the AM:  The anatomy & physiology of the digestive tract was discussed.  The pathophysiology of appendicitis and other appendiceal disorders were discussed.  Natural history risks without surgery was discussed.   I feel the risks of no intervention will lead to serious problems that outweigh the operative risks; therefore, I recommended diagnostic laparoscopy with removal of appendix to remove  the pathology.  Laparoscopic & open techniques were discussed.   I noted a good likelihood this will help address the problem.   Risks such as bleeding, infection, abscess, leak, reoperation, injury to other organs, need for repair of tissues / organs, possible ostomy, hernia, heart attack, stroke, death, and other risks were discussed.  Goals of post-operative recovery were discussed as well.  We will work to minimize complications.  Questions were answered.  The patient expresses understanding & wishes to proceed with surgery.  -VTE prophylaxis- SCDs, etc -mobilize as tolerated to help recovery  STOP SMOKING! We talked to the patient about the dangers of smoking.  We stressed that tobacco use dramatically increases the risk of peri-operative complications such as infection, tissue necrosis leaving to problems with incision/wound and organ healing, hernia, chronic pain, heart attack, stroke, DVT, pulmonary embolism, and death.  We noted there are programs in our community to help stop smoking.  Information was available.    Adin Hector, M.D., F.A.C.S. Gastrointestinal and Minimally Invasive Surgery [facility] [address], [place], [place] [address] [phone] Main / Paging   01/12/2015  Note: Portions of this report may have been transcribed using voice recognition software. Every effort was made to ensure  accuracy; however, inadvertent computerized transcription errors may be present.   Any transcriptional errors that result from this process are unintentional.

## 2015-01-12 NOTE — Anesthesia Postprocedure Evaluation (Signed)
Anesthesia Post Note  Patient: Douglas Mora  Procedure(s) Performed: Procedure(s) (LRB): APPENDECTOMY LAPAROSCOPIC (N/A)  Patient location during evaluation: PACU Anesthesia Type: General Level of consciousness: awake and alert Pain management: pain level controlled Vital Signs Assessment: post-procedure vital signs reviewed and stable Respiratory status: spontaneous breathing, nonlabored ventilation, respiratory function stable and patient connected to nasal cannula oxygen Cardiovascular status: blood pressure returned to baseline and stable Postop Assessment: no signs of nausea or vomiting Anesthetic complications: no    Last Vitals:  Filed Vitals:   01/12/15 1538 01/12/15 1651  BP: 135/66 135/62  Pulse: 75 78  Temp: 37.1 C 36.9 C  Resp: 18 18    Last Pain:  Filed Vitals:   01/12/15 1746  PainSc: 3                  Arabela Basaldua L

## 2015-01-12 NOTE — Anesthesia Preprocedure Evaluation (Addendum)
Anesthesia Evaluation  Patient identified by MRN, date of birth, ID band Patient awake    Reviewed: Allergy & Precautions, H&P , NPO status , Patient's Chart, lab work & pertinent test results, reviewed documented beta blocker date and time   Airway Mallampati: II  TM Distance: >3 FB Neck ROM: full    Dental no notable dental hx. (+) Dental Advisory Given, Teeth Intact   Pulmonary neg pulmonary ROS, Current Smoker,    Pulmonary exam normal breath sounds clear to auscultation       Cardiovascular Exercise Tolerance: Good negative cardio ROS Normal cardiovascular exam Rhythm:regular Rate:Normal     Neuro/Psych negative neurological ROS  negative psych ROS   GI/Hepatic negative GI ROS, Neg liver ROS,   Endo/Other  negative endocrine ROSLeft adrenal adenoma  Renal/GU negative Renal ROS  negative genitourinary   Musculoskeletal   Abdominal   Peds  Hematology negative hematology ROS (+)   Anesthesia Other Findings   Reproductive/Obstetrics negative OB ROS                             Anesthesia Physical Anesthesia Plan  ASA: II  Anesthesia Plan: General   Post-op Pain Management:    Induction: Intravenous, Rapid sequence and Cricoid pressure planned  Airway Management Planned: Oral ETT  Additional Equipment:   Intra-op Plan:   Post-operative Plan: Extubation in OR  Informed Consent: I have reviewed the patients History and Physical, chart, labs and discussed the procedure including the risks, benefits and alternatives for the proposed anesthesia with the patient or authorized representative who has indicated his/her understanding and acceptance.   Dental Advisory Given  Plan Discussed with: CRNA and Surgeon  Anesthesia Plan Comments:         Anesthesia Quick Evaluation

## 2015-01-13 ENCOUNTER — Encounter (HOSPITAL_COMMUNITY): Payer: Self-pay | Admitting: General Surgery

## 2015-01-13 MED ORDER — NAPROXEN SODIUM 220 MG PO TABS: ORAL_TABLET | ORAL | Status: DC

## 2015-01-13 MED ORDER — OXYCODONE HCL 5 MG PO TABS: 5.0000 mg | ORAL_TABLET | ORAL | Status: DC | PRN

## 2015-01-13 NOTE — Discharge Instructions (Signed)
Laparoscopic Appendectomy, Adult, Care After °Refer to this sheet in the next few weeks. These instructions provide you with information on caring for yourself after your procedure. Your caregiver may also give you more specific instructions. Your treatment has been planned according to current medical practices, but problems sometimes occur. Call your caregiver if you have any problems or questions after your procedure. °HOME CARE INSTRUCTIONS °· Do not drive while taking narcotic pain medicines. °· Use stool softener if you become constipated from your pain medicines. °· Change your bandages (dressings) as directed. °· Keep your wounds clean and dry. You may wash the wounds gently with soap and water. Gently pat the wounds dry with a clean towel. °· Do not take baths, swim, or use hot tubs for 10 days, or as instructed by your caregiver. °· Only take over-the-counter or prescription medicines for pain, discomfort, or fever as directed by your caregiver. °· You may continue your normal diet as directed. °· Do not lift more than 10 pounds (4.5 kg) or play contact sports for 3 weeks, or as directed. °· Slowly increase your activity after surgery. °· Take deep breaths to avoid getting a lung infection (pneumonia). °SEEK MEDICAL CARE IF: °· You have redness, swelling, or increasing pain in your wounds. °· You have pus coming from your wounds. °· You have drainage from a wound that lasts longer than 1 day. °· You notice a bad smell coming from the wounds or dressing. °· Your wound edges break open after stitches (sutures) have been removed. °· You notice increasing pain in the shoulders (shoulder strap areas) or near your shoulder blades. °· You develop dizzy episodes or fainting while standing. °· You develop shortness of breath. °· You develop persistent nausea or vomiting. °· You cannot control your bowel functions or lose your appetite. °· You develop diarrhea. °SEEK IMMEDIATE MEDICAL CARE IF:  °· You have a  fever. °· You develop a rash. °· You have difficulty breathing or sharp pains in your chest. °· You develop any reaction or side effects to medicines given. °MAKE SURE YOU: °· Understand these instructions. °· Will watch your condition. °· Will get help right away if you are not doing well or get worse. °  °This information is not intended to replace advice given to you by your health care provider. Make sure you discuss any questions you have with your health care provider. °  °Document Released: 01/30/2005 Document Revised: 06/16/2014 Document Reviewed: 07/20/2014 °Elsevier Interactive Patient Education ©2016 [employer] ° °CCS ______[facility] °LAPAROSCOPIC SURGERY: POST OP INSTRUCTIONS °Always review your discharge instruction sheet given to you by the facility where your surgery was performed. °IF YOU HAVE DISABILITY OR FAMILY LEAVE FORMS, YOU MUST BRING THEM TO THE OFFICE FOR PROCESSING.   °DO NOT GIVE THEM TO YOUR DOCTOR. ° °1. A prescription for pain medication may be given to you upon discharge.  Take your pain medication as prescribed, if needed.  If narcotic pain medicine is not needed, then you may take acetaminophen (Tylenol) or ibuprofen (Advil) as needed. °2. Take your usually prescribed medications unless otherwise directed. °3. If you need a refill on your pain medication, please contact your pharmacy.  They will contact our office to request authorization. Prescriptions will not be filled after 5pm or on week-ends. °4. You should follow a light diet the first few days after arrival home, such as soup and crackers, etc.  Be sure to include lots of fluids daily. °5. Most   patients will experience some swelling and bruising in the area of the incisions.  Ice packs will help.  Swelling and bruising can take several days to resolve.  °6. It is common to experience some constipation if taking pain medication after surgery.  Increasing fluid intake and taking a stool softener (such as  Colace) will usually help or prevent this problem from occurring.  A mild laxative (Milk of Magnesia or Miralax) should be taken according to package instructions if there are no bowel movements after 48 hours. °7. Unless discharge instructions indicate otherwise, you may remove your bandages 24-48 hours after surgery, and you may shower at that time.  You may have steri-strips (small skin tapes) in place directly over the incision.  These strips should be left on the skin for 7-10 days.  If your surgeon used skin glue on the incision, you may shower in 24 hours.  The glue will flake off over the next 2-3 weeks.  Any sutures or staples will be removed at the office during your follow-up visit. °8. ACTIVITIES:  You may resume regular (light) daily activities beginning the next day--such as daily self-care, walking, climbing stairs--gradually increasing activities as tolerated.  You may have sexual intercourse when it is comfortable.  Refrain from any heavy lifting or straining until approved by your doctor. °a. You may drive when you are no longer taking prescription pain medication, you can comfortably wear a seatbelt, and you can safely maneuver your car and apply brakes. °b. RETURN TO WORK:  __________________________________________________________ °9. You should see your doctor in the office for a follow-up appointment approximately 2-3 weeks after your surgery.  Make sure that you call for this appointment within a day or two after you arrive home to insure a convenient appointment time. °10. OTHER INSTRUCTIONS: __________________________________________________________________________________________________________________________ __________________________________________________________________________________________________________________________ °WHEN TO CALL YOUR DOCTOR: °1. Fever over 101.0 °2. Inability to urinate °3. Continued bleeding from incision. °4. Increased pain, redness, or drainage from the  incision. °5. Increasing abdominal pain ° °The clinic staff is available to answer your questions during regular business hours.  Please don’t hesitate to call and ask to speak to one of the nurses for clinical concerns.  If you have a medical emergency, go to the nearest emergency room or call 911.  A surgeon from [facility] is always on call at the hospital. °[address], [address], [place], [place]  [address] ? [address] [address], [place], [place]   [address] °[phone] ? [phone] ? FAX [phone] °Web site: [email or web address] ° °

## 2015-01-13 NOTE — Discharge Summary (Signed)
Physician Discharge Summary  Patient ID: JAVIEN MULLER MRN: [id number] DOB/AGE: 03/22/1965 49 y.o.  Admit date: 01/11/2015 Discharge date: 01/13/2015  Admission Diagnoses:  Acute cholecystitis with peritonitis Adenoma of left adrenal gland  Tobacco abuse  Discharge Diagnoses:  Same  Principal Problem:   Acute appendicitis Active Problems:   Adenoma of left adrenal gland   Tobacco abuse   Acute appendicitis with localized peritonitis   PROCEDURES: APPENDECTOMY LAPAROSCOPIC, 01/12/15, Dr. Marland Kitchen [facility] Course:  49 year old male without much past history who had abdominal pain starting 48 hours ago. Became more intense. Call his primary care physician. CT scan ordered. Concerning for appendicitis. Told go to emergency room. Workup and evaluation suspicious for appendicitis. Surgical consultation requested around 10:17 PM. I just completed my last surgery.  Patient here today with his wife. Has significant pain right lower quadrant. Does not want to move. Car riding bed shaking cough deftly bother him. He has never had a colonoscopy. No masses or bowel movement every morning. No severe bouts of constipation or diarrhea. No bleeding. No prior abdominal surgery. No history of hernias. No difficulty with urination. No personal nor family history of GI/colon cancer, inflammatory bowel disease, irritable bowel syndrome, allergy such as Celiac Sprue, dietary/dairy problems, colitis, ulcers nor gastritis. No recent sick contacts/gastroenteritis. No travel outside the country. No changes in diet. No dysphagia to solids or liquids. No significant heartburn or reflux. No hematochezia, hematemesis, coffee ground emesis. No evidence of prior gastric/peptic ulceration. He was seen in the ED and taken to the OR.  He has done well, ate a regular breakfast.  Walking and ready to go home.  We discharged him the following AM.    Condition on D/C:  Improved    Disposition: 01-Home or Self Care     Medication List    TAKE these medications        acetaminophen 500 MG tablet  Commonly known as:  TYLENOL  Take 1,000 mg by mouth every 6 (six) hours as needed for moderate pain.     ibuprofen 200 MG tablet  Commonly known as:  ADVIL,MOTRIN  Take 400 mg by mouth every 6 (six) hours as needed for moderate pain.     naproxen sodium 220 MG tablet  Commonly known as:  ANAPROX  You can take this or the Ibuprofen, but don't take both.  They are both of the same class and you can only take prescribed amounts.     oxyCODONE 5 MG immediate release tablet  Commonly known as:  Oxy IR/ROXICODONE  Take 1-2 tablets (5-10 mg total) by mouth every 4 (four) hours as needed for moderate pain, severe pain or breakthrough pain.           Follow-up Information    Follow up with [facility] On 01/27/2015.   Specialty:  General Surgery   Why:  Call tomorrow and confirm appointment.  You should have an 11AM appointment, be at the office at 10:30   Contact information:   [address] [place] [address] [phone]       Signed: Earnstine Regal 01/13/2015, 10:31 AM

## 2015-01-13 NOTE — Progress Notes (Signed)
1 Day Post-Op  Subjective: He is doing well, sore, but otherwise doing well.  Objective: Vital signs in last 24 hours: Temp:  [98.1 F (36.7 C)-100 F (37.8 C)] 98.8 F (37.1 C) (11/30 0504) Pulse Rate:  [58-78] 61 (11/30 0504) Resp:  [9-18] 18 (11/30 0504) BP: (110-137)/(56-81) 110/66 mmHg (11/30 0504) SpO2:  [95 %-100 %] 97 % (11/30 0504) Last BM Date: 01/11/15 600 PO  Regular diet 1700 urine TM 100, down since that temp yesterday AM, VSS No labs  Intake/Output from previous day: 11/29 0701 - 11/30 0700 In: 2773.8 [P.O.:600; I.V.:1873.8; IV Piggyback:300] Out: 1700 [Urine:1700] Intake/Output this shift:    General appearance: alert, cooperative and no distress GI: soft, sore, tolerating regular diet.  Lab Results:   Recent Labs  01/11/15 2203  WBC 11.8*  HGB 15.9  HCT 46.4  PLT 190    BMET  Recent Labs  01/11/15 2203  NA 136  K 3.6  CL 102  CO2 24  GLUCOSE 97  BUN 17  CREATININE 0.95  CALCIUM 8.7*   PT/INR No results for input(s): LABPROT, INR in the last 72 hours.  No results for input(s): AST, ALT, ALKPHOS, BILITOT, PROT, ALBUMIN in the last 168 hours.   Lipase  No results found for: LIPASE   Studies/Results: Ct Abdomen Pelvis W Contrast  01/11/2015  CLINICAL DATA:  Right lower quadrant pain EXAM: CT ABDOMEN AND PELVIS WITH CONTRAST TECHNIQUE: Multidetector CT imaging of the abdomen and pelvis was performed using the standard protocol following bolus administration of intravenous contrast. Oral contrast was also administered. CONTRAST:  42mL OMNIPAQUE IOHEXOL 300 MG/ML  SOLN COMPARISON:  None. FINDINGS: Lower chest: There is mild bibasilar atelectasis. Lung bases otherwise are clear. Hepatobiliary: No focal liver lesions are identified. Gallbladder wall is not appreciably thickened. There is no biliary duct dilatation. Pancreas: No pancreatic mass or inflammatory focus. Spleen: No splenic lesions are identified. Adrenals/Urinary Tract: Right  adrenal appears normal. There is a left adrenal adenoma measuring 2.5 x 2.2 cm. Kidneys bilaterally show no mass or hydronephrosis. There is no renal or ureteral calculus on either side. Urinary bladder is midline with wall thickness within normal limits. Stomach/Bowel: There is no bowel wall or mesenteric thickening. There are a few sigmoid diverticula without diverticulitis. There is fat within the wall of the terminal ileum. No bowel obstruction. No free air or portal venous air. Vascular/Lymphatic: There is no abdominal aortic aneurysm. The major mesenteric arterial vessels appear patent. No adenopathy is seen in the abdomen or pelvis. There are a few small lymph nodes in the right lower quadrant in the area of appendiceal inflammation, likely of reactive etiology. Reproductive: Prostate contains several small calculi. Prostate and seminal vesicles are normal in size and contour. There is no pelvic mass or pelvic fluid collection. Other: There is enlargement of the appendix with thickening of the appendiceal wall and surrounding inflammation consistent with acute appendicitis. No well-defined abscess. No free air is seen in this region. No ascites or abscess is seen in the abdomen or pelvis. Musculoskeletal: There is degenerative change in the lumbar spine. There are no blastic or lytic bone lesions. There is no intramuscular were abdominal wall lesion. IMPRESSION: Evidence of acute appendiceal inflammation. No frank abscess is seen in the periappendiceal region. Small lymph nodes in this area are likely of reactive etiology. Left adrenal adenoma. Fat in the wall of the terminal ileum.  No bowel obstruction. No renal or ureteral calculus.  No hydronephrosis. Critical Value/emergent results were  called by telephone at the time of interpretation on 01/11/2015 at 8:27 pm to Dr. Martinique, who is covering for Dr. Nancy Fetter, who verbally acknowledged these results. We arranged for patient to go straight from CT to the [facility] ED for further evaluation. Electronically Signed   By: Lowella Grip III M.D.   On: 01/11/2015 20:28    Medications: . heparin  5,000 Units Subcutaneous 3 times per day  . lip balm  1 application Topical BID  . metronidazole  500 mg Intravenous 4 times per day  . naproxen  500 mg Oral BID WC  . ondansetron  4 mg Intravenous 4 times per day  . saccharomyces boulardii  250 mg Oral BID   . dextrose 5 % and 0.45 % NaCl with KCl 40 mEq/L 75 mL/hr (01/13/15 0739)   Prior to Admission medications   Medication Sig Start Date End Date Taking? Authorizing Provider  acetaminophen (TYLENOL) 500 MG tablet Take 1,000 mg by mouth every 6 (six) hours as needed for moderate pain.   Yes Historical Provider, MD  ibuprofen (ADVIL,MOTRIN) 200 MG tablet Take 400 mg by mouth every 6 (six) hours as needed for moderate pain.   Yes Historical Provider, MD  naproxen sodium (ANAPROX) 220 MG tablet Take 440 mg by mouth 2 (two) times daily as needed (pain).   Yes Historical Provider, MD     Assessment/Plan Gangrenous appendicitis S/p laparoscopic appendectomy, 01/12/15, Dr. Excell Seltzer Antibiotics:  Completed 2 days of Rocephin day 3 metronidazole DVT:  Heparin/SCD   Plan: he is ready to go home.  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  Plan home this AM.    LOS: 1 day    Devera Englander 01/13/2015

## 2017-04-26 IMAGING — CT CT ABD-PELV W/ CM
2 of 6 series · 16 of 46 positions shown, 18 images · IV contrast (OMNIPAQUE)
Comparison: None.

CLINICAL DATA: Right lower quadrant pain

EXAM:
CT ABDOMEN AND PELVIS WITH CONTRAST
TECHNIQUE: Multidetector CT imaging of the abdomen and pelvis was performed
using the standard protocol following bolus administration of
intravenous contrast. Oral contrast was also administered.
CONTRAST:  75mL OMNIPAQUE IOHEXOL 300 MG/ML  SOLN

[Series 2: rtn a/p with · axial · 0.88mm/px · z∈[-511,-101]mm · 13 of 94 slices shown, 15 images]
[im 6/94  soft-tissue]
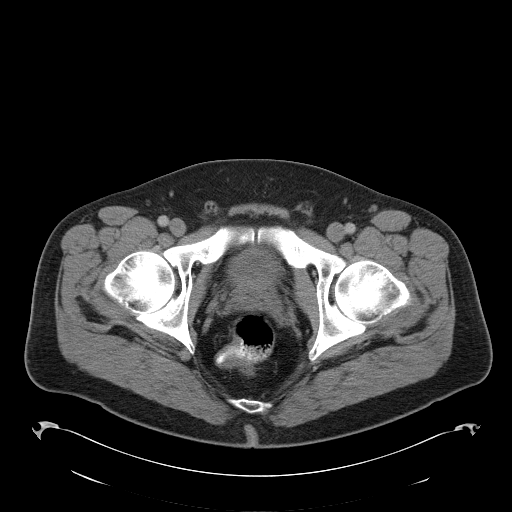
[im 6/94  bone]
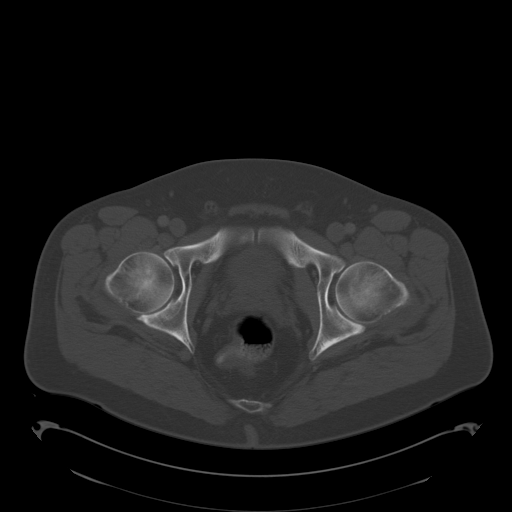
[im 11/94  soft-tissue]
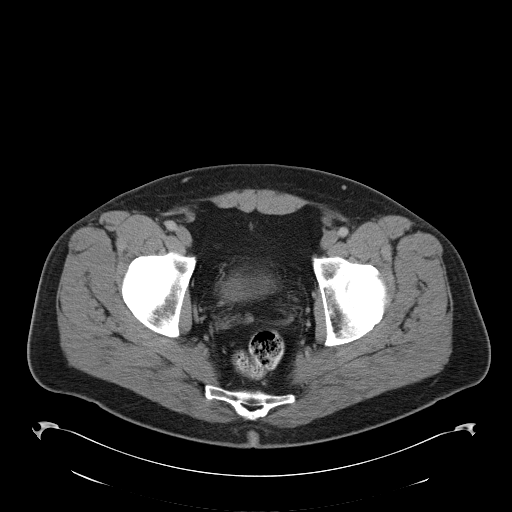
[im 22/94  soft-tissue]
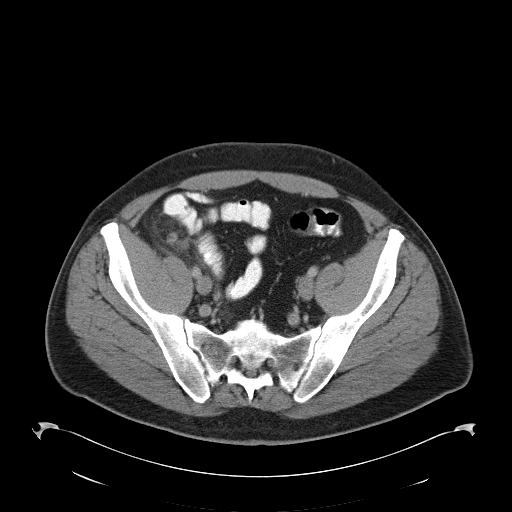
[im 28/94  soft-tissue]
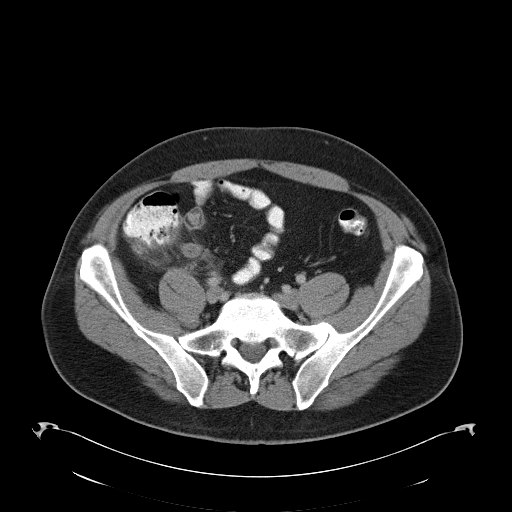
[im 33/94  soft-tissue]
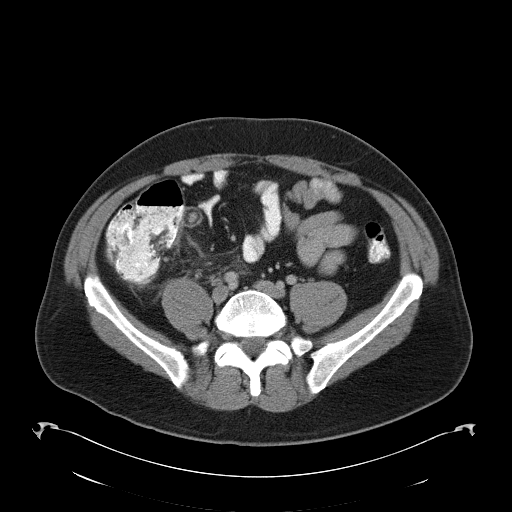
[im 39/94  soft-tissue]
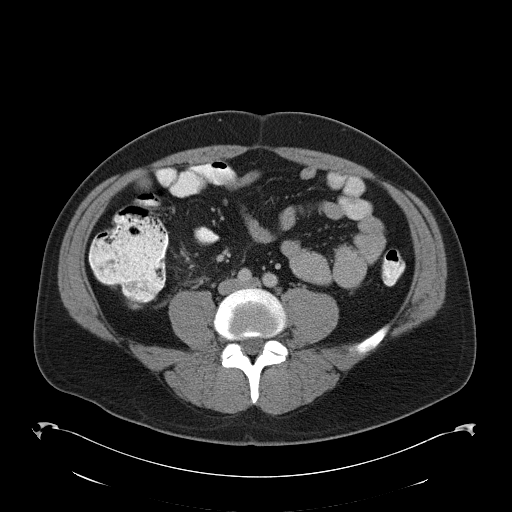
[im 50/94  soft-tissue]
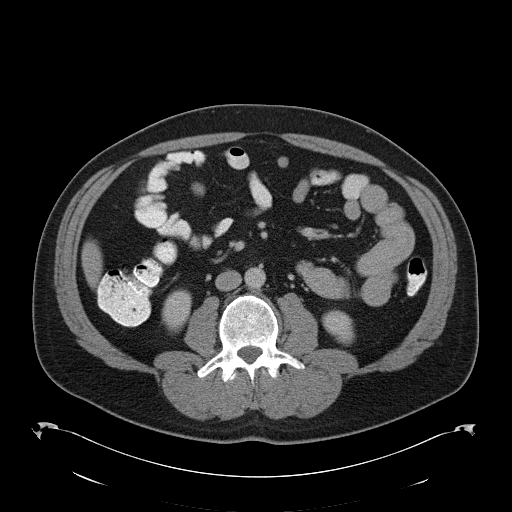
[im 55/94  soft-tissue]
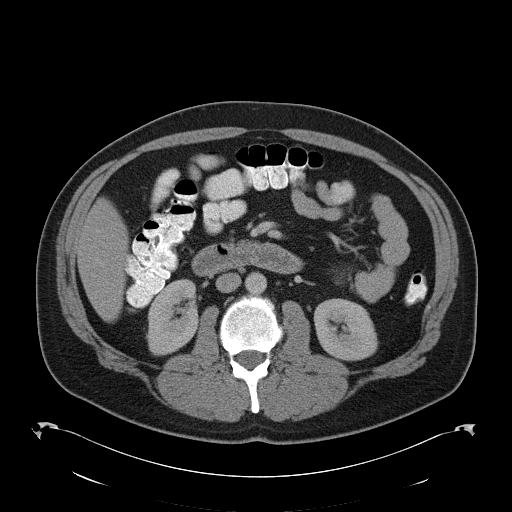
[im 61/94  soft-tissue]
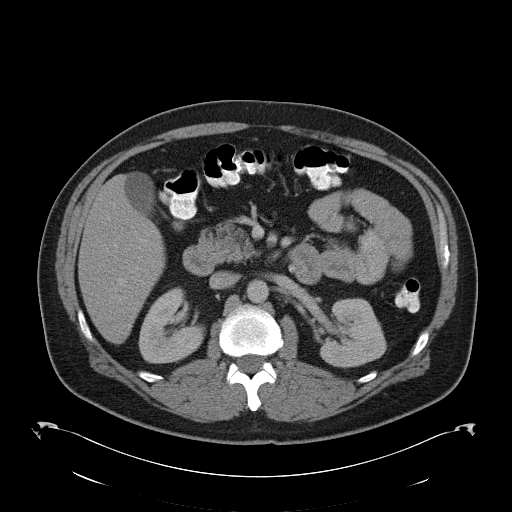
[im 61/94  bone]
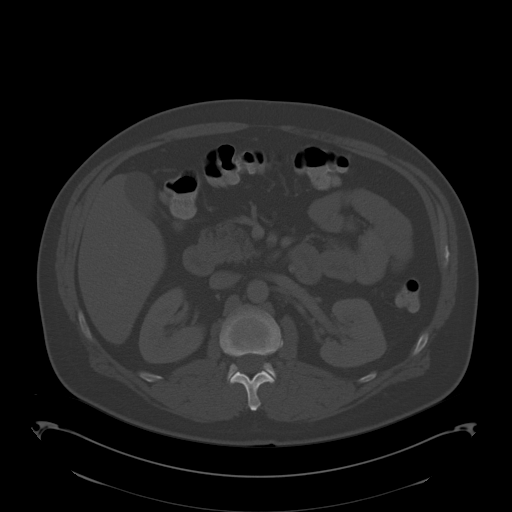
[im 66/94  soft-tissue]
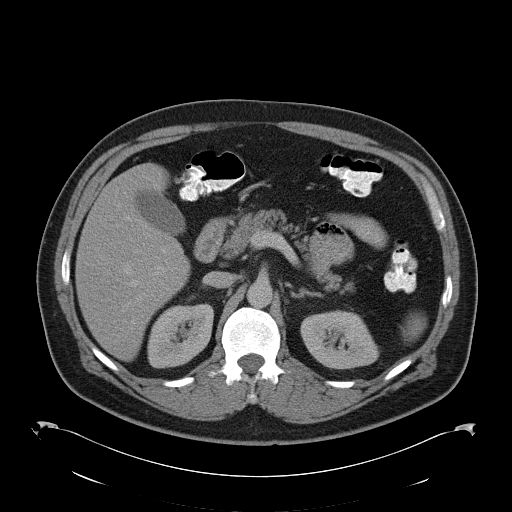
[im 72/94  soft-tissue]
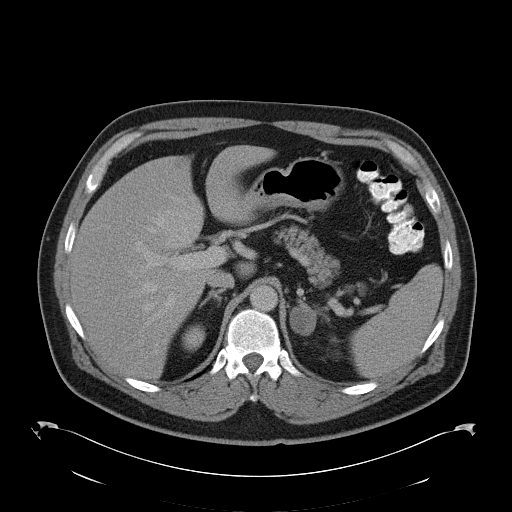
[im 83/94  soft-tissue]
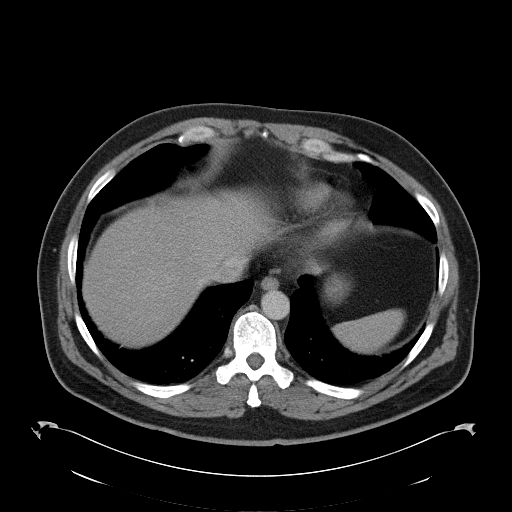
[im 88/94  soft-tissue]
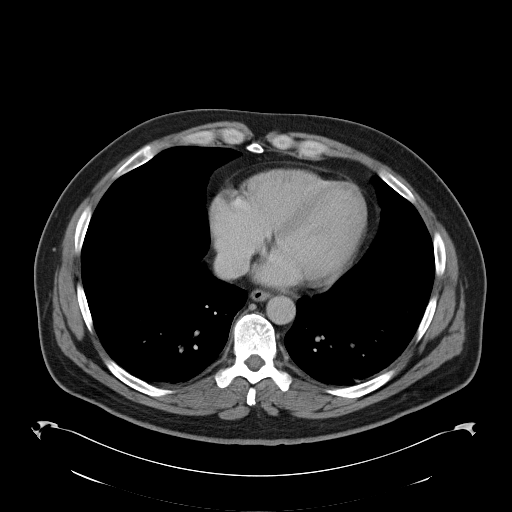

[Series 602: <mpr thick range> · coronal · 0.92mm/px · 3 of 160 slices shown]
[im 54/160  soft-tissue]
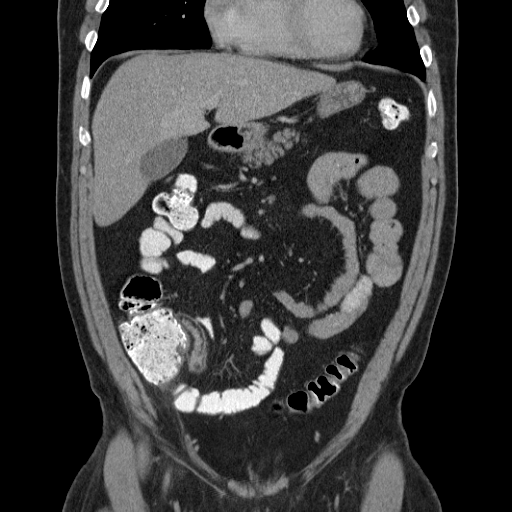
[im 71/160  soft-tissue]
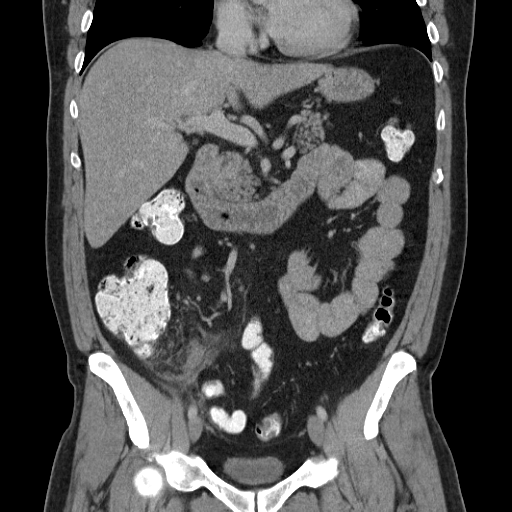
[im 89/160  soft-tissue]
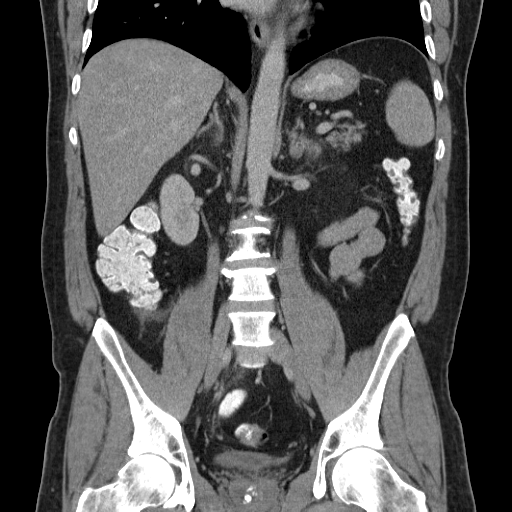

[16 of 46 positions shown; findings below may reference images not displayed]

FINDINGS: Lower chest: There is mild bibasilar atelectasis. Lung bases
otherwise are clear.

Hepatobiliary: No focal liver lesions are identified. Gallbladder
wall is not appreciably thickened. There is no biliary duct
dilatation.

Pancreas: No pancreatic mass or inflammatory focus.

Spleen: No splenic lesions are identified.

Adrenals/Urinary Tract: Right adrenal appears normal. There is a
left adrenal adenoma measuring 2.5 x 2.2 cm. Kidneys bilaterally
show no mass or hydronephrosis. There is no renal or ureteral
calculus on either side. Urinary bladder is midline with wall
thickness within normal limits.

Stomach/Bowel: There is no bowel wall or mesenteric thickening.
There are a few sigmoid diverticula without diverticulitis. There is
fat within the wall of the terminal ileum. No bowel obstruction. No
free air or portal venous air.

Vascular/Lymphatic: There is no abdominal aortic aneurysm. The major
mesenteric arterial vessels appear patent. No adenopathy is seen in
the abdomen or pelvis. There are a few small lymph nodes in the
right lower quadrant in the area of appendiceal inflammation, likely
of reactive etiology.

Reproductive: Prostate contains several small calculi. Prostate and
seminal vesicles are normal in size and contour. There is no pelvic
mass or pelvic fluid collection.

Other: There is enlargement of the appendix with thickening of the
appendiceal wall and surrounding inflammation consistent with acute
appendicitis. No well-defined abscess. No free air is seen in this
region.

No ascites or abscess is seen in the abdomen or pelvis.

Musculoskeletal: There is degenerative change in the lumbar spine.
There are no blastic or lytic bone lesions. There is no
intramuscular were abdominal wall lesion.
IMPRESSION: Evidence of acute appendiceal inflammation. No frank abscess is seen
in the periappendiceal region. Small lymph nodes in this area are
likely of reactive etiology.

Left adrenal adenoma.

Fat in the wall of the terminal ileum.  No bowel obstruction.

No renal or ureteral calculus.  No hydronephrosis.

Critical Value/emergent results were called by telephone at the time
of interpretation on 01/11/2015 at [DATE] to Dr. Ronald Alejandro, who is
covering for Dr. Rantona, who verbally acknowledged these results. We
arranged for patient to go straight from CT to the [REDACTED]
for further evaluation.

## 2019-06-19 ENCOUNTER — Ambulatory Visit: Payer: BLUE CROSS/BLUE SHIELD | Attending: Internal Medicine

## 2019-06-19 DIAGNOSIS — Z23 Encounter for immunization: Secondary | ICD-10-CM

## 2019-06-19 NOTE — Progress Notes (Signed)
   Covid-19 Vaccination Clinic  Name:  Douglas Mora    MRN: [id number] DOB: 03-Jan-1966  06/19/2019  Douglas Mora was observed post Covid-19 immunization for 15 minutes without incident. He was provided with Vaccine Information Sheet and instruction to access the V-Safe system.   Douglas Mora was instructed to call 911 with any severe reactions post vaccine: Marland Kitchen Difficulty breathing  . Swelling of face and throat  . A fast heartbeat  . A bad rash all over body  . Dizziness and weakness   Immunizations Administered    Name Date Dose VIS Date Route   Moderna COVID-19 Vaccine 06/19/2019  9:13 AM 0.5 mL 01/2019 Intramuscular   Manufacturer: Moderna   Lot: [id number]   [id number]

## 2019-07-16 ENCOUNTER — Encounter: Payer: Self-pay | Admitting: Emergency Medicine

## 2019-07-16 ENCOUNTER — Ambulatory Visit
Admission: EM | Admit: 2019-07-16 | Discharge: 2019-07-16 | Disposition: A | Discharge status: Home / Self Care | Payer: 59 | Attending: Emergency Medicine | Admitting: Emergency Medicine

## 2019-07-16 ENCOUNTER — Other Ambulatory Visit: Payer: Self-pay

## 2019-07-16 DIAGNOSIS — R1909 Other intra-abdominal and pelvic swelling, mass and lump: Secondary | ICD-10-CM

## 2019-07-16 DIAGNOSIS — L089 Local infection of the skin and subcutaneous tissue, unspecified: Secondary | ICD-10-CM

## 2019-07-16 DIAGNOSIS — W57XXXA Bitten or stung by nonvenomous insect and other nonvenomous arthropods, initial encounter: Secondary | ICD-10-CM

## 2019-07-16 DIAGNOSIS — S30863A Insect bite (nonvenomous) of scrotum and testes, initial encounter: Secondary | ICD-10-CM

## 2019-07-16 MED ORDER — DOXYCYCLINE HYCLATE 100 MG PO CAPS: 100.0000 mg | ORAL_CAPSULE | Freq: Two times a day (BID) | ORAL | 0 refills | Status: AC

## 2019-07-16 NOTE — Discharge Instructions (Signed)
Prescribed doxycycline.  Take as directed and to completion To prevent tick bites, wear long sleeves, long pants, and light colors. Use insect repellent. Follow the instructions on the bottle. If the tick is biting, do not try to remove it with heat, alcohol, petroleum jelly, or fingernail polish. Use tweezers, curved forceps, or a tick-removal tool to grasp the tick. Gently pull up until the tick lets go. Do not twist or jerk the tick. Do not squeeze or crush the tick. Return here or go to ER if you have any new or worsening symptoms (rash, nausea, vomiting, fever, chills, headache, fatigue)  

## 2019-07-16 NOTE — ED Triage Notes (Signed)
On Monday pt removed tick from middle of the front of his testicles, area is red and there is a knot. Swollen area to RT groin started Tuesday morning.

## 2019-07-16 NOTE — ED Provider Notes (Signed)
[place]   [id number] 07/16/19 Arrival Time: 0911  CC: Tick bite  SUBJECTIVE:  Douglas Mora is a 54 y.o. male complains of tick bite that occurred 2 days ago.  Localizes the bite to his testicle.  Removed tick as soon as he noticed it.  Unsure how long it was attached.  Now complains of groin swelling and itching.  Denies concern for STDs, is in a monogamous relationship with wife.  Reports previous hx of tick bite.  Denies fever, chills, nausea, vomiting, headache, dizziness, weakness, fatigue, rash, or abdominal pain.   ROS: As per HPI.  All other pertinent ROS negative.     History reviewed. No pertinent past medical history. Past Surgical History:  Procedure Laterality Date   LAPAROSCOPIC APPENDECTOMY N/A 01/12/2015   Procedure: APPENDECTOMY LAPAROSCOPIC;  Surgeon: Excell Seltzer, MD;  Location: [facility];  Service: General;  Laterality: N/A;   No Known Allergies No current facility-administered medications on file prior to encounter.   No current outpatient medications on file prior to encounter.   Social History   Socioeconomic History   Marital status: Married    Spouse name: Not on file   Number of children: Not on file   Years of education: Not on file   Highest education level: Not on file  Occupational History   Not on file  Tobacco Use   Smoking status: Current Every Day Smoker    Packs/day: 1.00   Smokeless tobacco: Never Used  Substance and Sexual Activity   Alcohol use: Yes    Comment: every day a couple of beers    Drug use: No   Sexual activity: Not on file  Other Topics Concern   Not on file  Social History Narrative   Not on file   Social Determinants of Health   Financial Resource Strain:    Difficulty of Paying Living Expenses:   Food Insecurity:    Worried About Charity fundraiser in the Last Year:    Arboriculturist in the Last Year:   Transportation Needs:    Film/video editor (Medical):    Lack  of Transportation (Non-Medical):   Physical Activity:    Days of Exercise per Week:    Minutes of Exercise per Session:   Stress:    Feeling of Stress :   Social Connections:    Frequency of Communication with Friends and Family:    Frequency of Social Gatherings with Friends and Family:    Attends Religious Services:    Active Member of Clubs or Organizations:    Attends Music therapist:    Marital Status:   Intimate Partner Violence:    Fear of Current or Ex-Partner:    Emotionally Abused:    Physically Abused:    Sexually Abused:    Family History  Problem Relation Age of Onset   Healthy Mother    Healthy Father     OBJECTIVE: Vitals:   07/16/19 0921 07/16/19 0943  BP: (!) 156/92   Pulse: 66   Resp: 17   Temp: 98 F (36.7 C)   TempSrc: Oral   SpO2: 96%   Weight:  250 lb (113.4 kg)  Height:  6\' 3"  (1.905 m)    General appearance: alert; no distress Head: NCAT Lungs: normal respiratory effort Extremities: no edema Skin: warm and dry;  GU: +RT side LAD, no obvious erythema or swelling to testicle, NTTP (RN Alyse Low present as chaperone Psychological: alert and cooperative; normal  mood and affect  ASSESSMENT & PLAN:  1. Nonvenomous insect bite of scrotum with infection, initial encounter   2. Groin swelling     Meds ordered this encounter  Medications   doxycycline (VIBRAMYCIN) 100 MG capsule    Sig: Take 1 capsule (100 mg total) by mouth 2 (two) times daily.    Dispense:  20 capsule    Refill:  0    Order Specific Question:   Supervising Provider    Answer:   Raylene Everts [id number]     Prescribed doxycycline.  Take as directed and to completion To prevent tick bites, wear long sleeves, long pants, and light colors. Use insect repellent. Follow the instructions on the bottle. If the tick is biting, do not try to remove it with heat, alcohol, petroleum jelly, or fingernail polish. Use tweezers, curved forceps, or a  tick-removal tool to grasp the tick. Gently pull up until the tick lets go. Do not twist or jerk the tick. Do not squeeze or crush the tick. Return here or go to ER if you have any new or worsening symptoms (rash, nausea, vomiting, fever, chills, headache, fatigue)   Reviewed expectations re: course of current medical issues. Questions answered. Outlined signs and symptoms indicating need for more acute intervention. Patient verbalized understanding. After Visit Summary given.   Lestine Box, PA-C 07/16/19 1009

## 2019-07-17 ENCOUNTER — Ambulatory Visit: Payer: BLUE CROSS/BLUE SHIELD | Attending: Internal Medicine

## 2019-07-17 DIAGNOSIS — Z23 Encounter for immunization: Secondary | ICD-10-CM

## 2019-07-17 NOTE — Progress Notes (Signed)
   Covid-19 Vaccination Clinic  Name:  Douglas Mora    MRN: [id number] DOB: 1965/10/27  07/17/2019  Douglas Mora was observed post Covid-19 immunization for 15 minutes without incident. He was provided with Vaccine Information Sheet and instruction to access the V-Safe system.   Douglas Mora was instructed to call 911 with any severe reactions post vaccine: Marland Kitchen Difficulty breathing  . Swelling of face and throat  . A fast heartbeat  . A bad rash all over body  . Dizziness and weakness   Immunizations Administered    Name Date Dose VIS Date Route   Moderna COVID-19 Vaccine 07/17/2019  9:09 AM 0.5 mL 01/2019 Intramuscular   Manufacturer: Moderna   Lot: [id number]   [id number]

## 2023-04-03 DIAGNOSIS — L720 Epidermal cyst: Secondary | ICD-10-CM | POA: Diagnosis not present

## 2023-04-03 DIAGNOSIS — L570 Actinic keratosis: Secondary | ICD-10-CM | POA: Diagnosis not present

## 2023-04-03 DIAGNOSIS — X32XXXD Exposure to sunlight, subsequent encounter: Secondary | ICD-10-CM | POA: Diagnosis not present

## 2023-04-17 DIAGNOSIS — L720 Epidermal cyst: Secondary | ICD-10-CM | POA: Diagnosis not present

## 2023-05-08 DIAGNOSIS — L708 Other acne: Secondary | ICD-10-CM | POA: Diagnosis not present

## 2023-05-08 DIAGNOSIS — L570 Actinic keratosis: Secondary | ICD-10-CM | POA: Diagnosis not present

## 2023-05-08 DIAGNOSIS — X32XXXD Exposure to sunlight, subsequent encounter: Secondary | ICD-10-CM | POA: Diagnosis not present

## 2023-05-08 DIAGNOSIS — L578 Other skin changes due to chronic exposure to nonionizing radiation: Secondary | ICD-10-CM | POA: Diagnosis not present

## 2023-07-10 DIAGNOSIS — D582 Other hemoglobinopathies: Secondary | ICD-10-CM | POA: Diagnosis not present

## 2023-07-10 DIAGNOSIS — Z720 Tobacco use: Secondary | ICD-10-CM | POA: Diagnosis not present

## 2023-07-10 DIAGNOSIS — E669 Obesity, unspecified: Secondary | ICD-10-CM | POA: Diagnosis not present

## 2023-07-10 DIAGNOSIS — Z0000 Encounter for general adult medical examination without abnormal findings: Secondary | ICD-10-CM | POA: Diagnosis not present

## 2023-07-10 DIAGNOSIS — E7800 Pure hypercholesterolemia, unspecified: Secondary | ICD-10-CM | POA: Diagnosis not present

## 2023-07-10 DIAGNOSIS — Z125 Encounter for screening for malignant neoplasm of prostate: Secondary | ICD-10-CM | POA: Diagnosis not present

## 2023-07-11 ENCOUNTER — Encounter: Payer: Self-pay | Admitting: Emergency Medicine

## 2023-07-11 DIAGNOSIS — R7301 Impaired fasting glucose: Secondary | ICD-10-CM | POA: Diagnosis not present

## 2023-09-02 DIAGNOSIS — J440 Chronic obstructive pulmonary disease with acute lower respiratory infection: Secondary | ICD-10-CM | POA: Diagnosis not present

## 2023-09-04 ENCOUNTER — Other Ambulatory Visit: Payer: Self-pay | Admitting: *Deleted

## 2023-09-04 ENCOUNTER — Telehealth: Payer: Self-pay | Admitting: *Deleted

## 2023-09-04 DIAGNOSIS — Z122 Encounter for screening for malignant neoplasm of respiratory organs: Secondary | ICD-10-CM

## 2023-09-04 DIAGNOSIS — Z87891 Personal history of nicotine dependence: Secondary | ICD-10-CM

## 2023-09-04 NOTE — Telephone Encounter (Signed)
 Lung Cancer Screening Narrative/Criteria Questionnaire (Cigarette Smokers Only- No Cigars/Pipes/vapes)   Douglas Mora   SDMV:09/07/23 9:45- [name]                                           Dec 29, 1965              LDCT: 09/12/23 7:00- AP    57 y.o.   Phone: [phone] or [phone]  Lung Screening Narrative (confirm age 49-77 yrs Medicare / 50-80 yrs Private pay insurance)   Insurance information:BCBS   Referring Provider:Pahwani   This screening involves an initial phone call with a team member from our program. It is called a shared decision making visit. The initial meeting is required by insurance and Medicare to make sure you understand the program. This appointment takes about 15-20 minutes to complete. The CT scan will completed at a separate date/time. This scan takes about 5-10 minutes to complete and you may eat and drink before and after the scan.  Criteria questions for Lung Cancer Screening:   Are you a current or former smoker? Former Age began smoking: 14   If you are a former smoker, what year did you quit smoking? Quit 6 months ago and for a 10 yr period of time while smoking (within 15 yrs)   To calculate your smoking history, I need an accurate estimate of how many packs of cigarettes you smoked per day and for how many years. (Not just the number of PPD you are now smoking)   Years smoking 33 x Packs per day 3/4 = Pack years 25   (at least 20 pack yrs)   (Make sure they understand that we need to know how much they have smoked in the past, not just the number of PPD they are smoking now)  Do you have a personal history of cancer?  No    Do you have a family history of cancer? Yes  (cancer type and and relative) Mother (lymphoma)  Are you coughing up blood?  No  Have you had unexplained weight loss of 15 lbs or more in the last 6 months? No  It looks like you meet all criteria.     Additional information: N/A

## 2023-09-06 ENCOUNTER — Encounter: Payer: Self-pay | Admitting: Adult Health

## 2023-09-06 NOTE — Patient Instructions (Signed)

## 2023-09-06 NOTE — Progress Notes (Unsigned)
  Virtual Visit via Telephone Note  I connected with KENAI FLUEGEL , 09/07/23 9:42 AM by a telemedicine application and verified that I am speaking with the correct person using two identifiers.  Location: Patient: home Provider: home   I discussed the limitations of evaluation and management by telemedicine and the availability of in person appointments. The patient expressed understanding and agreed to proceed.   Shared Decision Making Visit Lung Cancer Screening Program [phone])   Eligibility: 60 y.o. Pack Years Smoking History Calculation = 25 pack years  (# packs/per year x # years smoked) Recent History of coughing up blood  no Unexplained weight loss? no ( >Than 15 pounds within the last 6 months ) Prior History Lung / other cancer no (Diagnosis within the last 5 years already requiring surveillance chest CT Scans). Smoking Status Former Smoker Former Smokers: Years since quit: < 1 year  Quit Date: 19mo ago   Visit Components: Discussion included one or more decision making aids. YES Discussion included risk/benefits of screening. YES Discussion included potential follow up diagnostic testing for abnormal scans. YES Discussion included meaning and risk of over diagnosis. YES Discussion included meaning and risk of False Positives. YES Discussion included meaning of total radiation exposure. YES  Counseling Included: Importance of adherence to annual lung cancer LDCT screening. YES Impact of comorbidities on ability to participate in the program. YES Ability and willingness to under diagnostic treatment. YES  Smoking Cessation Counseling: Former Smokers:  Discussed the importance of maintaining cigarette abstinence. yes Diagnosis Code: Personal History of Nicotine Dependence. S12.108 Information about tobacco cessation classes and interventions provided to patient. Yes Patient provided with ticket for LDCT Scan. yes Written Order for Lung Cancer Screening with  LDCT placed in Epic. Yes (CT Chest Lung Cancer Screening Low Dose W/O CM) [id number]  Z12.2-Screening of respiratory organs Z87.891-Personal history of nicotine dependence   Lamarr Myers 09/07/23

## 2023-09-07 ENCOUNTER — Ambulatory Visit (INDEPENDENT_AMBULATORY_CARE_PROVIDER_SITE_OTHER): Admitting: Adult Health

## 2023-09-07 DIAGNOSIS — Z87891 Personal history of nicotine dependence: Secondary | ICD-10-CM

## 2023-09-12 ENCOUNTER — Ambulatory Visit (HOSPITAL_COMMUNITY): Payer: Self-pay

## 2023-09-20 ENCOUNTER — Ambulatory Visit (HOSPITAL_COMMUNITY)
Admission: RE | Admit: 2023-09-20 | Discharge: 2023-09-20 | Disposition: A | Discharge status: Home / Self Care | Source: Ambulatory Visit | Attending: Acute Care | Admitting: Acute Care

## 2023-09-20 DIAGNOSIS — Z87891 Personal history of nicotine dependence: Secondary | ICD-10-CM | POA: Diagnosis present

## 2023-09-20 DIAGNOSIS — Z122 Encounter for screening for malignant neoplasm of respiratory organs: Secondary | ICD-10-CM | POA: Diagnosis present

## 2023-10-03 ENCOUNTER — Other Ambulatory Visit: Payer: Self-pay

## 2023-10-03 DIAGNOSIS — Z122 Encounter for screening for malignant neoplasm of respiratory organs: Secondary | ICD-10-CM

## 2023-10-03 DIAGNOSIS — Z87891 Personal history of nicotine dependence: Secondary | ICD-10-CM
